# Patient Record
Sex: Male | Born: 1995 | Race: White | Hispanic: No | Marital: Single | Smoking: Never smoker
Health system: Southern US, Community
[De-identification: ages and names within clinical notes are randomized; demographics above are authoritative.]

## PROBLEM LIST (undated history)

## (undated) HISTORY — PX: CYST REMOVAL TRUNK: SHX6283

---
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
## 2014-10-17 ENCOUNTER — Encounter (HOSPITAL_BASED_OUTPATIENT_CLINIC_OR_DEPARTMENT_OTHER): Payer: Self-pay | Admitting: Emergency Medicine

## 2014-10-17 ENCOUNTER — Emergency Department (HOSPITAL_BASED_OUTPATIENT_CLINIC_OR_DEPARTMENT_OTHER): Payer: 59

## 2014-10-17 ENCOUNTER — Emergency Department (HOSPITAL_BASED_OUTPATIENT_CLINIC_OR_DEPARTMENT_OTHER)
Admission: EM | Admit: 2014-10-17 | Discharge: 2014-10-17 | Disposition: A | Discharge status: Home / Self Care | Payer: 59 | Attending: Emergency Medicine | Admitting: Emergency Medicine

## 2014-10-17 DIAGNOSIS — S39012A Strain of muscle, fascia and tendon of lower back, initial encounter: Secondary | ICD-10-CM

## 2014-10-17 DIAGNOSIS — Z791 Long term (current) use of non-steroidal anti-inflammatories (NSAID): Secondary | ICD-10-CM | POA: Insufficient documentation

## 2014-10-17 DIAGNOSIS — Z79899 Other long term (current) drug therapy: Secondary | ICD-10-CM | POA: Insufficient documentation

## 2014-10-17 DIAGNOSIS — Y9389 Activity, other specified: Secondary | ICD-10-CM | POA: Insufficient documentation

## 2014-10-17 DIAGNOSIS — S299XXA Unspecified injury of thorax, initial encounter: Secondary | ICD-10-CM | POA: Insufficient documentation

## 2014-10-17 DIAGNOSIS — Y92410 Unspecified street and highway as the place of occurrence of the external cause: Secondary | ICD-10-CM | POA: Insufficient documentation

## 2014-10-17 MED ORDER — OXYCODONE-ACETAMINOPHEN 5-325 MG PO TABS
1.0000 | ORAL_TABLET | Freq: Once | ORAL | Status: AC
  Administered 2014-10-17: 1 via ORAL
  Filled 2014-10-17: qty 1
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
## 2015-09-05 ENCOUNTER — Other Ambulatory Visit: Payer: Self-pay | Admitting: Internal Medicine

## 2015-09-05 DIAGNOSIS — R1013 Epigastric pain: Secondary | ICD-10-CM

## 2015-09-21 ENCOUNTER — Other Ambulatory Visit: Payer: Self-pay
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
## 2017-09-25 ENCOUNTER — Other Ambulatory Visit: Payer: Self-pay | Admitting: Internal Medicine

## 2017-09-25 DIAGNOSIS — R1013 Epigastric pain: Secondary | ICD-10-CM
